# Patient Record
Sex: Male | Born: 1940 | Race: White | Hispanic: No | Marital: Married | Smoking: Former smoker
Health system: Southern US, Community
[De-identification: ages and names within clinical notes are randomized; demographics above are authoritative.]

## PROBLEM LIST (undated history)

## (undated) DIAGNOSIS — R030 Elevated blood-pressure reading, without diagnosis of hypertension: Secondary | ICD-10-CM

## (undated) DIAGNOSIS — E785 Hyperlipidemia, unspecified: Secondary | ICD-10-CM

## (undated) DIAGNOSIS — K5790 Diverticulosis of intestine, part unspecified, without perforation or abscess without bleeding: Secondary | ICD-10-CM

## (undated) DIAGNOSIS — G252 Other specified forms of tremor: Secondary | ICD-10-CM

## (undated) DIAGNOSIS — D126 Benign neoplasm of colon, unspecified: Secondary | ICD-10-CM

## (undated) DIAGNOSIS — I2510 Atherosclerotic heart disease of native coronary artery without angina pectoris: Secondary | ICD-10-CM

## (undated) DIAGNOSIS — R319 Hematuria, unspecified: Secondary | ICD-10-CM

## (undated) DIAGNOSIS — N529 Male erectile dysfunction, unspecified: Secondary | ICD-10-CM

## (undated) DIAGNOSIS — U071 COVID-19: Secondary | ICD-10-CM

## (undated) DIAGNOSIS — Z860100 Personal history of colon polyps, unspecified: Secondary | ICD-10-CM

## (undated) HISTORY — DX: Elevated blood-pressure reading, without diagnosis of hypertension: R03.0

## (undated) HISTORY — DX: Atherosclerotic heart disease of native coronary artery without angina pectoris: I25.10

## (undated) HISTORY — DX: Hyperlipidemia, unspecified: E78.5

## (undated) HISTORY — DX: Personal history of colon polyps, unspecified: Z86.0100

## (undated) HISTORY — DX: COVID-19: U07.1

## (undated) HISTORY — DX: Other specified forms of tremor: G25.2

## (undated) HISTORY — DX: Benign neoplasm of colon, unspecified: D12.6

## (undated) HISTORY — DX: Diverticulosis of intestine, part unspecified, without perforation or abscess without bleeding: K57.90

## (undated) HISTORY — DX: Male erectile dysfunction, unspecified: N52.9

## (undated) HISTORY — PX: APPENDECTOMY: SHX54

## (undated) HISTORY — DX: Hematuria, unspecified: R31.9

---

## 2016-05-17 DIAGNOSIS — Z131 Encounter for screening for diabetes mellitus: Secondary | ICD-10-CM | POA: Diagnosis not present

## 2016-05-17 DIAGNOSIS — Z125 Encounter for screening for malignant neoplasm of prostate: Secondary | ICD-10-CM | POA: Diagnosis not present

## 2016-05-17 DIAGNOSIS — E785 Hyperlipidemia, unspecified: Secondary | ICD-10-CM | POA: Diagnosis not present

## 2016-05-17 DIAGNOSIS — Z0000 Encounter for general adult medical examination without abnormal findings: Secondary | ICD-10-CM | POA: Diagnosis not present

## 2016-10-10 DIAGNOSIS — D492 Neoplasm of unspecified behavior of bone, soft tissue, and skin: Secondary | ICD-10-CM | POA: Diagnosis not present

## 2016-10-10 DIAGNOSIS — D229 Melanocytic nevi, unspecified: Secondary | ICD-10-CM | POA: Diagnosis not present

## 2016-10-10 DIAGNOSIS — L820 Inflamed seborrheic keratosis: Secondary | ICD-10-CM | POA: Diagnosis not present

## 2016-10-10 DIAGNOSIS — L821 Other seborrheic keratosis: Secondary | ICD-10-CM | POA: Diagnosis not present

## 2018-03-25 DIAGNOSIS — L821 Other seborrheic keratosis: Secondary | ICD-10-CM | POA: Diagnosis not present

## 2018-03-25 DIAGNOSIS — D229 Melanocytic nevi, unspecified: Secondary | ICD-10-CM | POA: Diagnosis not present

## 2018-03-25 DIAGNOSIS — L820 Inflamed seborrheic keratosis: Secondary | ICD-10-CM | POA: Diagnosis not present

## 2018-03-25 DIAGNOSIS — D485 Neoplasm of uncertain behavior of skin: Secondary | ICD-10-CM | POA: Diagnosis not present

## 2019-05-23 DIAGNOSIS — Z0000 Encounter for general adult medical examination without abnormal findings: Secondary | ICD-10-CM | POA: Diagnosis not present

## 2019-05-23 DIAGNOSIS — E785 Hyperlipidemia, unspecified: Secondary | ICD-10-CM | POA: Diagnosis not present

## 2019-05-23 DIAGNOSIS — Z125 Encounter for screening for malignant neoplasm of prostate: Secondary | ICD-10-CM | POA: Diagnosis not present

## 2019-05-23 DIAGNOSIS — R030 Elevated blood-pressure reading, without diagnosis of hypertension: Secondary | ICD-10-CM | POA: Diagnosis not present

## 2019-06-13 DIAGNOSIS — Z125 Encounter for screening for malignant neoplasm of prostate: Secondary | ICD-10-CM | POA: Diagnosis not present

## 2019-06-13 DIAGNOSIS — R030 Elevated blood-pressure reading, without diagnosis of hypertension: Secondary | ICD-10-CM | POA: Diagnosis not present

## 2019-06-13 DIAGNOSIS — E785 Hyperlipidemia, unspecified: Secondary | ICD-10-CM | POA: Diagnosis not present

## 2019-07-28 DIAGNOSIS — R972 Elevated prostate specific antigen [PSA]: Secondary | ICD-10-CM | POA: Diagnosis not present

## 2020-03-12 DIAGNOSIS — Z1159 Encounter for screening for other viral diseases: Secondary | ICD-10-CM | POA: Diagnosis not present

## 2020-03-17 DIAGNOSIS — D125 Benign neoplasm of sigmoid colon: Secondary | ICD-10-CM | POA: Diagnosis not present

## 2020-03-17 DIAGNOSIS — D123 Benign neoplasm of transverse colon: Secondary | ICD-10-CM | POA: Diagnosis not present

## 2020-03-17 DIAGNOSIS — Z86010 Personal history of colonic polyps: Secondary | ICD-10-CM | POA: Diagnosis not present

## 2020-03-19 DIAGNOSIS — D125 Benign neoplasm of sigmoid colon: Secondary | ICD-10-CM | POA: Diagnosis not present

## 2020-03-19 DIAGNOSIS — D123 Benign neoplasm of transverse colon: Secondary | ICD-10-CM | POA: Diagnosis not present

## 2020-05-25 DIAGNOSIS — K4090 Unilateral inguinal hernia, without obstruction or gangrene, not specified as recurrent: Secondary | ICD-10-CM | POA: Diagnosis not present

## 2020-05-25 DIAGNOSIS — Z23 Encounter for immunization: Secondary | ICD-10-CM | POA: Diagnosis not present

## 2020-05-25 DIAGNOSIS — R030 Elevated blood-pressure reading, without diagnosis of hypertension: Secondary | ICD-10-CM | POA: Diagnosis not present

## 2020-05-25 DIAGNOSIS — Z0000 Encounter for general adult medical examination without abnormal findings: Secondary | ICD-10-CM | POA: Diagnosis not present

## 2020-05-25 DIAGNOSIS — E785 Hyperlipidemia, unspecified: Secondary | ICD-10-CM | POA: Diagnosis not present

## 2020-05-25 DIAGNOSIS — R001 Bradycardia, unspecified: Secondary | ICD-10-CM | POA: Diagnosis not present

## 2021-01-11 ENCOUNTER — Ambulatory Visit: Payer: Medicare PPO | Admitting: Dermatology

## 2021-01-11 ENCOUNTER — Encounter: Payer: Self-pay | Admitting: Dermatology

## 2021-01-11 ENCOUNTER — Other Ambulatory Visit: Payer: Self-pay

## 2021-01-11 DIAGNOSIS — D485 Neoplasm of uncertain behavior of skin: Secondary | ICD-10-CM

## 2021-01-11 DIAGNOSIS — L918 Other hypertrophic disorders of the skin: Secondary | ICD-10-CM | POA: Diagnosis not present

## 2021-01-11 DIAGNOSIS — L821 Other seborrheic keratosis: Secondary | ICD-10-CM

## 2021-01-11 DIAGNOSIS — Z1283 Encounter for screening for malignant neoplasm of skin: Secondary | ICD-10-CM

## 2021-01-11 DIAGNOSIS — D0439 Carcinoma in situ of skin of other parts of face: Secondary | ICD-10-CM

## 2021-01-11 NOTE — Patient Instructions (Signed)

## 2021-01-17 ENCOUNTER — Telehealth: Payer: Self-pay

## 2021-01-17 NOTE — Telephone Encounter (Signed)
-----   Message from Lavonna Monarch, MD sent at 01/14/2021  7:29 AM EDT ----- Schedule surgery with Dr. Darene Lamer

## 2021-01-17 NOTE — Telephone Encounter (Signed)
Left message to call office needs 30 minute for scc

## 2021-01-18 NOTE — Telephone Encounter (Signed)
Patient is returning telephone call from yesterday about pathology results.

## 2021-01-24 ENCOUNTER — Telehealth: Payer: Self-pay | Admitting: Dermatology

## 2021-01-24 NOTE — Telephone Encounter (Signed)
Results, ST. Has called before but no one called him back

## 2021-01-24 NOTE — Telephone Encounter (Signed)
Pathology to patient-surgery appointment scheduled.  

## 2021-01-26 ENCOUNTER — Encounter: Payer: Self-pay | Admitting: Dermatology

## 2021-01-26 ENCOUNTER — Other Ambulatory Visit: Payer: Self-pay

## 2021-01-26 ENCOUNTER — Ambulatory Visit (INDEPENDENT_AMBULATORY_CARE_PROVIDER_SITE_OTHER): Payer: Medicare PPO | Admitting: Dermatology

## 2021-01-26 DIAGNOSIS — D0439 Carcinoma in situ of skin of other parts of face: Secondary | ICD-10-CM

## 2021-01-26 DIAGNOSIS — D099 Carcinoma in situ, unspecified: Secondary | ICD-10-CM

## 2021-01-26 NOTE — Patient Instructions (Signed)

## 2021-01-30 ENCOUNTER — Encounter: Payer: Self-pay | Admitting: Dermatology

## 2021-01-30 NOTE — Progress Notes (Signed)
   Follow-Up Visit   Subjective  Matthew Singleton is a 80 y.o. male who presents for the following: Annual Exam (Left forehead x 1 month- little red & tender as first now just rough).  General skin examination, growth on forehead Location:  Duration:  Quality:  Associated Signs/Symptoms: Modifying Factors:  Severity:  Timing: Context:   Objective  Well appearing patient in no apparent distress; mood and affect are within normal limits. Mid Back Full body skin exam.  No atypical pigmented lesions.  1 possible nonmelanoma skin cancer forehead will be biopsied  Left Forehead Waxy pink 6 mm crust, superficial carcinoma       Mid Back Multiple textured brown flattopped 4 8 mm papules  Left Axilla, Right Axilla Fleshy, skin-colored  pedunculated papules.      A full examination was performed including scalp, head, eyes, ears, nose, lips, neck, chest, axillae, abdomen, back, buttocks, bilateral upper extremities, bilateral lower extremities, hands, feet, fingers, toes, fingernails, and toenails. All findings within normal limits unless otherwise noted below.  Areas beneath under garment not fully examined   Assessment & Plan    Screening for malignant neoplasm of skin Mid Back  Yearly skin exams.   Neoplasm of uncertain behavior of skin Left Forehead  Skin / nail biopsy Type of biopsy: tangential   Informed consent: discussed and consent obtained   Timeout: patient name, date of birth, surgical site, and procedure verified   Anesthesia: the lesion was anesthetized in a standard fashion   Anesthetic:  1% lidocaine w/ epinephrine 1-100,000 local infiltration Instrument used: flexible razor blade   Hemostasis achieved with: ferric subsulfate   Outcome: patient tolerated procedure well   Post-procedure details: wound care instructions given    Specimen 1 - Surgical pathology Differential Diagnosis: scc vs bcc  Check Margins: No  Seborrheic keratosis Mid  Back  Leave if stable  Skin tag (2) Left Axilla; Right Axilla  No intervention necessary      I, Lavonna Monarch, MD, have reviewed all documentation for this visit.  The documentation on 01/30/21 for the exam, diagnosis, procedures, and orders are all accurate and complete.

## 2021-02-12 ENCOUNTER — Encounter: Payer: Self-pay | Admitting: Dermatology

## 2021-03-09 NOTE — Progress Notes (Signed)
   Follow-Up Visit   Subjective  Matthew Singleton is a 80 y.o. male who presents for the following: Procedure (Here for treatment- left forehead- cis x 1).  Skin cancer left forehead Location:  Duration:  Quality:  Associated Signs/Symptoms: Modifying Factors:  Severity:  Timing: Context:   Objective  Well appearing patient in no apparent distress; mood and affect are within normal limits. Left Forehead Lesion identified by Dr.Neima Lacross and nurse in room.      A focused examination was performed including head and neck. Relevant physical exam findings are noted in the Assessment and Plan.   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  Destruction of lesion Complexity: simple   Destruction method: electrodesiccation and curettage   Informed consent: discussed and consent obtained   Timeout:  patient name, date of birth, surgical site, and procedure verified Anesthesia: the lesion was anesthetized in a standard fashion   Anesthetic:  1% lidocaine w/ epinephrine 1-100,000 local infiltration Curettage performed in three different directions: Yes   Curettage cycles:  3 Lesion length (cm):  1.2 Lesion width (cm):  1.2 Margin per side (cm):  0 Final wound size (cm):  1.2 Hemostasis achieved with:  ferric subsulfate Outcome: patient tolerated procedure well with no complications   Post-procedure details: sterile dressing applied and wound care instructions given   Dressing type: bandage and petrolatum   Additional details:  Wound innoculated with 5 fluorouracil solution.      I, Lavonna Monarch, MD, have reviewed all documentation for this visit.  The documentation on 03/09/21 for the exam, diagnosis, procedures, and orders are all accurate and complete.

## 2021-03-17 ENCOUNTER — Encounter: Payer: Medicare PPO | Admitting: Dermatology

## 2021-04-25 ENCOUNTER — Encounter: Payer: Self-pay | Admitting: Dermatology

## 2021-04-25 ENCOUNTER — Ambulatory Visit: Payer: Medicare Other | Admitting: Dermatology

## 2021-04-25 ENCOUNTER — Other Ambulatory Visit: Payer: Self-pay

## 2021-04-25 DIAGNOSIS — Z8589 Personal history of malignant neoplasm of other organs and systems: Secondary | ICD-10-CM | POA: Diagnosis not present

## 2021-04-25 DIAGNOSIS — D692 Other nonthrombocytopenic purpura: Secondary | ICD-10-CM | POA: Diagnosis not present

## 2021-04-25 DIAGNOSIS — L821 Other seborrheic keratosis: Secondary | ICD-10-CM | POA: Diagnosis not present

## 2021-04-25 NOTE — Patient Instructions (Signed)
Dermend over the counter pick up lotion for the arms.

## 2021-05-15 NOTE — Progress Notes (Signed)
° °  Follow-Up Visit   Subjective  Matthew Singleton is a 81 y.o. male who presents for the following: Follow-up (Follow up for lesion on forehead. Pt was here in October and treated scc.).  Follow-up with CIS left forehead and check other spots. Location:  Duration:  Quality:  Associated Signs/Symptoms: Modifying Factors:  Severity:  Timing: Context:   Objective  Well appearing patient in no apparent distress; mood and affect are within normal limits. Left Malar Cheek, Right Temple Textured brown flattopped papules, temple lesion recurrent.  Also has an incidental skin tag behind Ear.   Left Forearm - Posterior Ecchymoses forearms, denies other abnormal bleeding  Left Forehead No sign residual carcinoma.    A focused examination was performed including head, neck, arms. Relevant physical exam findings are noted in the Assessment and Plan.   Assessment & Plan    Seborrheic keratosis (2) Right Temple; Left Malar Cheek  Leave if stable  Solar purpura ([place]) Left Forearm - Posterior  Pick up over the counter dermend lotion   History of squamous cell carcinoma Left Forehead      I, Lavonna Monarch, MD, have reviewed all documentation for this visit.  The documentation on 05/15/21 for the exam, diagnosis, procedures, and orders are all accurate and complete.

## 2021-06-02 DIAGNOSIS — R001 Bradycardia, unspecified: Secondary | ICD-10-CM | POA: Diagnosis not present

## 2021-06-02 DIAGNOSIS — R030 Elevated blood-pressure reading, without diagnosis of hypertension: Secondary | ICD-10-CM | POA: Diagnosis not present

## 2021-06-02 DIAGNOSIS — E785 Hyperlipidemia, unspecified: Secondary | ICD-10-CM | POA: Diagnosis not present

## 2021-06-02 DIAGNOSIS — Z0000 Encounter for general adult medical examination without abnormal findings: Secondary | ICD-10-CM | POA: Diagnosis not present

## 2021-06-07 ENCOUNTER — Other Ambulatory Visit: Payer: Self-pay | Admitting: Family Medicine

## 2021-06-07 DIAGNOSIS — K4090 Unilateral inguinal hernia, without obstruction or gangrene, not specified as recurrent: Secondary | ICD-10-CM

## 2021-06-13 ENCOUNTER — Other Ambulatory Visit: Payer: Self-pay

## 2021-06-13 ENCOUNTER — Ambulatory Visit
Admission: RE | Admit: 2021-06-13 | Discharge: 2021-06-13 | Disposition: A | Discharge status: Home / Self Care | Payer: Medicare Other | Source: Ambulatory Visit | Attending: Family Medicine | Admitting: Family Medicine

## 2021-06-13 DIAGNOSIS — K4090 Unilateral inguinal hernia, without obstruction or gangrene, not specified as recurrent: Secondary | ICD-10-CM

## 2021-06-27 DIAGNOSIS — K4090 Unilateral inguinal hernia, without obstruction or gangrene, not specified as recurrent: Secondary | ICD-10-CM | POA: Diagnosis not present

## 2021-08-25 DIAGNOSIS — K4090 Unilateral inguinal hernia, without obstruction or gangrene, not specified as recurrent: Secondary | ICD-10-CM | POA: Diagnosis not present

## 2021-11-01 ENCOUNTER — Encounter: Payer: Self-pay | Admitting: Dermatology

## 2021-11-01 ENCOUNTER — Ambulatory Visit: Payer: Medicare Other | Admitting: Dermatology

## 2021-11-01 DIAGNOSIS — D485 Neoplasm of uncertain behavior of skin: Secondary | ICD-10-CM

## 2021-11-01 DIAGNOSIS — Z85828 Personal history of other malignant neoplasm of skin: Secondary | ICD-10-CM | POA: Diagnosis not present

## 2021-11-01 DIAGNOSIS — L820 Inflamed seborrheic keratosis: Secondary | ICD-10-CM

## 2021-11-01 NOTE — Patient Instructions (Signed)

## 2021-11-23 ENCOUNTER — Encounter: Payer: Self-pay | Admitting: Dermatology

## 2021-11-23 NOTE — Progress Notes (Signed)
   Follow-Up Visit   Subjective  Matthew Singleton is a 81 y.o. male who presents for the following: Skin Problem (Left forehead near a old lesion x months).  Check site of skin cancer left forehead, new crust nearby Location:  Duration:  Quality:  Associated Signs/Symptoms: Modifying Factors:  Severity:  Timing: Context:   Objective  Well appearing patient in no apparent distress; mood and affect are within normal limits. Left sup  Forehead  eroded waxy pink 8 mm crust, dermoscopy looks like a collision of irritated keratosis and superficial carcinoma     Left Forehead On site of carcinoma in situ from last year appears to be clear    A focused examination was performed including head and neck. Relevant physical exam findings are noted in the Assessment and Plan.   Assessment & Plan    Neoplasm of uncertain behavior of skin Left sup  Forehead  Skin / nail biopsy Type of biopsy: tangential   Informed consent: discussed and consent obtained   Timeout: patient name, date of birth, surgical site, and procedure verified   Anesthesia: the lesion was anesthetized in a standard fashion   Anesthetic:  1% lidocaine w/ epinephrine 1-100,000 local infiltration Instrument used: flexible razor blade   Hemostasis achieved with: aluminum chloride and electrodesiccation   Outcome: patient tolerated procedure well   Post-procedure details: wound care instructions given    Specimen 1 - Surgical pathology Differential Diagnosis: bcc scc  Check Margins: No  Personal history of skin cancer Left Forehead  Recheck as needed change      I, Lavonna Monarch, MD, have reviewed all documentation for this visit.  The documentation on 11/23/21 for the exam, diagnosis, procedures, and orders are all accurate and complete.

## 2022-01-11 ENCOUNTER — Ambulatory Visit: Payer: Medicare PPO | Admitting: Dermatology

## 2022-06-07 DIAGNOSIS — U071 COVID-19: Secondary | ICD-10-CM | POA: Diagnosis not present

## 2022-06-16 DIAGNOSIS — R001 Bradycardia, unspecified: Secondary | ICD-10-CM | POA: Diagnosis not present

## 2022-06-16 DIAGNOSIS — E785 Hyperlipidemia, unspecified: Secondary | ICD-10-CM | POA: Diagnosis not present

## 2022-06-16 DIAGNOSIS — R030 Elevated blood-pressure reading, without diagnosis of hypertension: Secondary | ICD-10-CM | POA: Diagnosis not present

## 2022-06-16 DIAGNOSIS — Z0000 Encounter for general adult medical examination without abnormal findings: Secondary | ICD-10-CM | POA: Diagnosis not present

## 2022-07-25 DIAGNOSIS — H26493 Other secondary cataract, bilateral: Secondary | ICD-10-CM | POA: Diagnosis not present

## 2022-07-25 DIAGNOSIS — H1045 Other chronic allergic conjunctivitis: Secondary | ICD-10-CM | POA: Diagnosis not present

## 2022-07-25 DIAGNOSIS — Z961 Presence of intraocular lens: Secondary | ICD-10-CM | POA: Diagnosis not present

## 2022-07-25 DIAGNOSIS — H04123 Dry eye syndrome of bilateral lacrimal glands: Secondary | ICD-10-CM | POA: Diagnosis not present

## 2022-08-17 DIAGNOSIS — J189 Pneumonia, unspecified organism: Secondary | ICD-10-CM | POA: Diagnosis not present

## 2022-08-18 ENCOUNTER — Other Ambulatory Visit: Payer: Self-pay | Admitting: Family Medicine

## 2022-08-18 ENCOUNTER — Ambulatory Visit
Admission: RE | Admit: 2022-08-18 | Discharge: 2022-08-18 | Disposition: A | Discharge status: Home / Self Care | Payer: Medicare Other | Source: Ambulatory Visit | Attending: Family Medicine | Admitting: Family Medicine

## 2022-08-18 DIAGNOSIS — J189 Pneumonia, unspecified organism: Secondary | ICD-10-CM

## 2022-08-24 DIAGNOSIS — R9389 Abnormal findings on diagnostic imaging of other specified body structures: Secondary | ICD-10-CM | POA: Diagnosis not present

## 2022-08-29 ENCOUNTER — Ambulatory Visit
Admission: RE | Admit: 2022-08-29 | Discharge: 2022-08-29 | Disposition: A | Discharge status: Home / Self Care | Payer: Medicare Other | Source: Ambulatory Visit | Attending: Family Medicine | Admitting: Family Medicine

## 2022-08-29 ENCOUNTER — Other Ambulatory Visit: Payer: Self-pay | Admitting: Family Medicine

## 2022-08-29 DIAGNOSIS — R9389 Abnormal findings on diagnostic imaging of other specified body structures: Secondary | ICD-10-CM

## 2022-08-29 DIAGNOSIS — K449 Diaphragmatic hernia without obstruction or gangrene: Secondary | ICD-10-CM | POA: Diagnosis not present

## 2022-08-29 DIAGNOSIS — J439 Emphysema, unspecified: Secondary | ICD-10-CM | POA: Diagnosis not present

## 2022-08-29 DIAGNOSIS — I700 Atherosclerosis of aorta: Secondary | ICD-10-CM | POA: Diagnosis not present

## 2022-08-29 DIAGNOSIS — R918 Other nonspecific abnormal finding of lung field: Secondary | ICD-10-CM | POA: Diagnosis not present

## 2022-08-29 MED ORDER — IOPAMIDOL (ISOVUE-300) INJECTION 61%
75.0000 mL | Freq: Once | INTRAVENOUS | Status: AC | PRN
  Administered 2022-08-29: 75 mL via INTRAVENOUS

## 2022-09-05 NOTE — Progress Notes (Unsigned)
   Synopsis: Referred for hydropneumothorax by Shon Hale, *  Subjective:   PATIENT ID: Matthew Singleton GENDER: male DOB: [birth year], MRN: [id number]  No chief complaint on file.  82yM with history of referred for R fluid filled bulla measuring 11cm  Otherwise pertinent review of systems is negative.  No past medical history on file.   No family history on file.   No past surgical history on file.  Social History   Socioeconomic History   Marital status: Single    Spouse name: Not on file   Number of children: Not on file   Years of education: Not on file   Highest education level: Not on file  Occupational History   Not on file  Tobacco Use   Smoking status: Unknown   Smokeless tobacco: Not on file  Vaping Use   Vaping Use: Never used  Substance and Sexual Activity   Alcohol use: Not Currently   Drug use: Never   Sexual activity: Not on file  Other Topics Concern   Not on file  Social History Narrative   Not on file   Social Determinants of Health   Financial Resource Strain: Not on file  Food Insecurity: Not on file  Transportation Needs: Not on file  Physical Activity: Not on file  Stress: Not on file  Social Connections: Not on file  Intimate Partner Violence: Not on file     Allergies  Allergen Reactions   Penicillins Rash     No outpatient medications prior to visit.   No facility-administered medications prior to visit.       Objective:   Physical Exam:  General appearance: 82 y.o., male, NAD, conversant  Eyes: anicteric sclerae; PERRL, tracking appropriately HENT: NCAT; MMM Neck: Trachea midline; no lymphadenopathy, no JVD Lungs: CTAB, no crackles, no wheeze, with normal respiratory effort CV: RRR, no murmur  Abdomen: Soft, non-tender; non-distended, BS present  Extremities: No peripheral edema, warm Skin: Normal turgor and texture; no rash Psych: Appropriate affect Neuro: Alert and oriented to person and place, no focal  deficit     There were no vitals filed for this visit.   on *** LPM *** RA BMI Readings from Last 3 Encounters:  No data found for BMI   Wt Readings from Last 3 Encounters:  No data found for Wt     CBC No results found for: "WBC", "RBC", "HGB", "HCT", "PLT", "MCV", "MCH", "MCHC", "RDW", "LYMPHSABS", "MONOABS", "EOSABS", "BASOSABS"  ***  Chest Imaging: CT Chest 08/29/22 with large hiatal hernia, 11cm bullous lesion, fluid filled with some thickening of wall, paraseptal emphysema, LUL 1.1cm pleural nodule  Pulmonary Functions Testing Results:     No data to display          FeNO: ***  Pathology: ***  Echocardiogram: ***  Heart Catheterization: ***    Assessment & Plan:    Plan:      Omar Person, MD [facility] 09/05/2022 11:24 AM

## 2022-09-06 ENCOUNTER — Encounter: Payer: Self-pay | Admitting: Student

## 2022-09-06 ENCOUNTER — Ambulatory Visit: Payer: Medicare Other | Admitting: Student

## 2022-09-06 VITALS — BP 128/70 | HR 60 | Temp 98.2°F | Ht 73.0 in | Wt 183.4 lb

## 2022-09-06 DIAGNOSIS — J852 Abscess of lung without pneumonia: Secondary | ICD-10-CM | POA: Diagnosis not present

## 2022-09-06 MED ORDER — CEFDINIR 300 MG PO CAPS
300.0000 mg | ORAL_CAPSULE | Freq: Two times a day (BID) | ORAL | 0 refills | Status: DC
Start: 2022-09-06 — End: 2022-09-06

## 2022-09-06 MED ORDER — CEFDINIR 300 MG PO CAPS: 300.0000 mg | ORAL_CAPSULE | Freq: Two times a day (BID) | ORAL | 0 refills | Status: DC

## 2022-09-06 NOTE — Patient Instructions (Addendum)
-   4 weeks of cefdinir - CT Chest in 6-8 weeks

## 2022-10-20 ENCOUNTER — Ambulatory Visit (HOSPITAL_BASED_OUTPATIENT_CLINIC_OR_DEPARTMENT_OTHER)
Admission: RE | Admit: 2022-10-20 | Discharge: 2022-10-20 | Disposition: A | Discharge status: Home / Self Care | Payer: Medicare Other | Source: Ambulatory Visit | Attending: Student | Admitting: Student

## 2022-10-20 DIAGNOSIS — J432 Centrilobular emphysema: Secondary | ICD-10-CM | POA: Diagnosis not present

## 2022-10-20 DIAGNOSIS — J852 Abscess of lung without pneumonia: Secondary | ICD-10-CM | POA: Insufficient documentation

## 2022-10-20 DIAGNOSIS — I700 Atherosclerosis of aorta: Secondary | ICD-10-CM | POA: Diagnosis not present

## 2022-10-20 DIAGNOSIS — R918 Other nonspecific abnormal finding of lung field: Secondary | ICD-10-CM | POA: Diagnosis not present

## 2022-11-20 ENCOUNTER — Encounter: Payer: Self-pay | Admitting: Pulmonary Disease

## 2022-11-20 ENCOUNTER — Ambulatory Visit: Payer: Medicare Other | Admitting: Pulmonary Disease

## 2022-11-20 VITALS — BP 130/76 | HR 57 | Temp 97.9°F | Ht 73.0 in | Wt 188.4 lb

## 2022-11-20 DIAGNOSIS — J852 Abscess of lung without pneumonia: Secondary | ICD-10-CM | POA: Diagnosis not present

## 2022-11-20 MED ORDER — CEFDINIR 300 MG PO CAPS: 300.0000 mg | ORAL_CAPSULE | Freq: Two times a day (BID) | ORAL | 1 refills | Status: DC

## 2022-11-20 NOTE — Progress Notes (Signed)
Synopsis: Referred for hydropneumothorax by Shon Hale, *  Subjective:   PATIENT ID: Matthew Singleton GENDER: male DOB: 21-Dec-1940, MRN: [id number]  Chief Complaint  Patient presents with   Follow-up    Doing well   82yM with history of referred for R fluid filled bulla measuring 11cm. Had covid-19 in February 2024 in [place]. Bout of pneumonia 5/15 seen by PCP - he doesn't recall feeling all that bad but did have pleuritic CP, some cough, low grade fever. He was on a z pack and something else, possibly omnicef. For the last couple weeks however he has had cough with clearish sputum. Pleuritic CP has improved.   He has no trouble with solid or liquid dysphagia  No recent prior CT Chest   Otherwise pertinent review of systems is negative.  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  No vaping, MJ.   Today 11/20/22 CT Chest 10/20/22 showed decrease in size of the cavitary lesion of RUL which now measures 4.5 x 4.2cm comapred to 6.2 x 4.3cm. Other nodules are stable in size.  Patient reports feeling much better since last visit.  He has significant less cough.  He denies any mucus production.  Denies any fevers, chills or night sweats.  Denies any lack of appetite or weight loss.   No past medical history on file.   Family History  Problem Relation Age of Onset   Heart attack Father    Throat cancer Father        smoked   Cancer - Lung Sister        smoked     No past surgical history on file.  Social History   Socioeconomic History   Marital status: Single    Spouse name: Not on file   Number of children: Not on file   Years of education: Not on file   Highest education level: Not on file  Occupational History   Not on file  Tobacco Use   Smoking status: Former    Current packs/day: 0.00    Average packs/day: 2.0 packs/day for 14.0 years (28.0 ttl  pk-yrs)    Types: Cigarettes    Start date: 04/10/1958    Quit date: 04/10/1972    Years since quitting: 50.6   Smokeless tobacco: Not on file  Vaping Use   Vaping status: Never Used  Substance and Sexual Activity   Alcohol use: Not Currently   Drug use: Never   Sexual activity: Not on file  Other Topics Concern   Not on file  Social History Narrative   Retired Naval architect   Social Determinants of Corporate investment banker Strain: Not on file  Food Insecurity: Not on file  Transportation Needs: Not on file  Physical Activity: Not on file  Stress: Not on file  Social Connections: Not on file  Intimate Partner Violence: Not on file     Allergies  Allergen Reactions   Penicillins Rash     Outpatient Medications Prior to Visit  Medication Sig Dispense Refill   loratadine (CLARITIN) 10 MG tablet Take 10 mg by mouth daily as needed for allergies.     cefdinir (OMNICEF) 300 MG capsule Take 1 capsule (300 mg total) by mouth 2 (two) times daily. 56 capsule 0   No facility-administered medications prior to visit.       Objective:  Physical Exam:  General appearance: 82 y.o., male, NAD, conversant  Eyes: anicteric sclerae; PERRL, tracking appropriately HENT: NCAT; MMM Neck: Trachea midline; no lymphadenopathy, no JVD Lungs: CTAB, no crackles, no wheeze, with normal respiratory effort CV: RRR, no murmur  Abdomen: Soft, non-tender; non-distended, BS present  Extremities: No peripheral edema, warm Skin: Normal turgor and texture; no rash Psych: Appropriate affect Neuro: Alert and oriented to person and place, no focal deficit     Vitals:   11/20/22 1128  BP: 130/76  Pulse: (!) 57  Temp: 97.9 F (36.6 C)  TempSrc: Oral  SpO2: (!) 8%  Weight: 188 lb 6.4 oz (85.5 kg)  Height: 6\' 1"  (1.854 m)    (!) 8% on RA BMI Readings from Last 3 Encounters:  11/20/22 24.86 kg/m  09/06/22 24.20 kg/m   Wt Readings from Last 3 Encounters:  11/20/22 188 lb 6.4 oz (85.5  kg)  09/06/22 183 lb 6.4 oz (83.2 kg)     CBC No results found for: "WBC", "RBC", "HGB", "HCT", "PLT", "MCV", "MCH", "MCHC", "RDW", "LYMPHSABS", "MONOABS", "EOSABS", "BASOSABS"   Chest Imaging: CT Chest 08/29/22 with large hiatal hernia, 11cm bullous lesion, fluid filled with some thickening of wall, paraseptal emphysema, LUL 1.1cm pleural nodule  Pulmonary Functions Testing Results:     No data to display              Assessment & Plan:   # RLL Lung abscess  Alternatively superinfected cyst  # Mild extent of paraseptal emphysema  # History of 25 py smoking, quit 1974  Plan: - Resume cefdinir daily for 2 more months - CT Chest in 2 months   Follow up in 2 months after CT scan with myself or APP.   Martina Sinner, MD [facility] 11/27/2022 10:59 AM

## 2022-11-20 NOTE — Patient Instructions (Addendum)
Resume Cefdinir two times daily for 2 more months  We will repeat a CT Chest scan in 2 months  Follow up with our nurse practitioners in 2 months after CT Chest scan

## 2022-11-27 ENCOUNTER — Encounter: Payer: Self-pay | Admitting: Pulmonary Disease

## 2023-02-05 ENCOUNTER — Ambulatory Visit (HOSPITAL_BASED_OUTPATIENT_CLINIC_OR_DEPARTMENT_OTHER)
Admission: RE | Admit: 2023-02-05 | Discharge: 2023-02-05 | Disposition: A | Discharge status: Home / Self Care | Payer: Medicare Other | Source: Ambulatory Visit | Attending: Pulmonary Disease | Admitting: Pulmonary Disease

## 2023-02-05 DIAGNOSIS — R918 Other nonspecific abnormal finding of lung field: Secondary | ICD-10-CM | POA: Diagnosis not present

## 2023-02-05 DIAGNOSIS — J852 Abscess of lung without pneumonia: Secondary | ICD-10-CM | POA: Diagnosis not present

## 2023-02-05 DIAGNOSIS — J432 Centrilobular emphysema: Secondary | ICD-10-CM | POA: Diagnosis not present

## 2023-02-14 ENCOUNTER — Ambulatory Visit: Payer: Medicare Other | Admitting: Pulmonary Disease

## 2023-02-14 ENCOUNTER — Encounter: Payer: Self-pay | Admitting: Pulmonary Disease

## 2023-02-14 VITALS — BP 150/80 | HR 70 | Ht 73.0 in | Wt 190.2 lb

## 2023-02-14 DIAGNOSIS — J852 Abscess of lung without pneumonia: Secondary | ICD-10-CM

## 2023-02-14 NOTE — Patient Instructions (Signed)
Your CT chest scan shows improvement compared to the last one.  We will repeat a CT Chest scan in 6 months  Follow up in 6 months after the CT scan

## 2023-02-14 NOTE — Progress Notes (Signed)
Synopsis: Referred for hydropneumothorax by Shon Hale, *  Subjective:   PATIENT ID: Matthew Singleton GENDER: male DOB: 05-Jul-1940, MRN: [id number]  Chief Complaint  Patient presents with   Follow-up    Pt is here for CT F/U visit.    82yM with history of referred for R fluid filled bulla measuring 11cm. Had covid-19 in February 2024 in [place]. Bout of pneumonia 5/15 seen by PCP - he doesn't recall feeling all that bad but did have pleuritic CP, some cough, low grade fever. He was on a z pack and something else, possibly omnicef. For the last couple weeks however he has had cough with clearish sputum. Pleuritic CP has improved.   He has no trouble with solid or liquid dysphagia  No recent prior CT Chest   Otherwise pertinent review of systems is negative.  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  No vaping, MJ.   OV 11/20/22 CT Chest 10/20/22 showed decrease in size of the cavitary lesion of RUL which now measures 4.5 x 4.2cm comapred to 6.2 x 4.3cm. Other nodules are stable in size.  Patient reports feeling much better since last visit.  He has significant less cough.  He denies any mucus production.  Denies any fevers, chills or night sweats.  Denies any lack of appetite or weight loss.  Today OV 02/14/23  follow-up visit after completing a course of antibiotics in October. They report no significant respiratory symptoms, with only a minor 'old man Thursday morning cough.' They deny any significant production of mucus or sputum.  The patient had a cavitary lesion in the lung, which has been monitored through CT scans. The most recent scan shows significant improvement in the lesion, which has mostly closed up and is now primarily scar tissue.    No past medical history on file.   Family History  Problem Relation Age of Onset   Heart attack Father    Throat  cancer Father        smoked   Cancer - Lung Sister        smoked     No past surgical history on file.  Social History   Socioeconomic History   Marital status: Single    Spouse name: Not on file   Number of children: Not on file   Years of education: Not on file   Highest education level: Not on file  Occupational History   Not on file  Tobacco Use   Smoking status: Former    Current packs/day: 0.00    Average packs/day: 2.0 packs/day for 14.0 years (28.0 ttl pk-yrs)    Types: Cigarettes    Start date: 04/10/1958    Quit date: 04/10/1972    Years since quitting: 50.8   Smokeless tobacco: Not on file  Vaping Use   Vaping status: Never Used  Substance and Sexual Activity   Alcohol use: Not Currently   Drug use: Never   Sexual activity: Not on file  Other Topics Concern   Not on file  Social History Narrative   Retired Naval architect   Social Determinants of Corporate investment banker Strain: Not on file  Food Insecurity: Not on file  Transportation Needs: Not on file  Physical Activity: Not on file  Stress: Not on file  Social Connections: Not on file  Intimate Partner Violence: Not  on file     Allergies  Allergen Reactions   Penicillins Rash     Outpatient Medications Prior to Visit  Medication Sig Dispense Refill   loratadine (CLARITIN) 10 MG tablet Take 10 mg by mouth daily as needed for allergies.     cefdinir (OMNICEF) 300 MG capsule Take 1 capsule (300 mg total) by mouth 2 (two) times daily. 56 capsule 1   No facility-administered medications prior to visit.       Objective:   Physical Exam:  General appearance: 82 y.o., male, NAD, conversant  Eyes: anicteric sclerae; PERRL, tracking appropriately HENT: NCAT; MMM Neck: Trachea midline; no lymphadenopathy, no JVD Lungs: CTAB, no crackles, no wheeze, with normal respiratory effort CV: RRR, no murmur  Abdomen: Soft, non-tender; non-distended, BS present  Extremities: No peripheral edema,  warm Skin: Normal turgor and texture; no rash Psych: Appropriate affect Neuro: Alert and oriented to person and place, no focal deficit     Vitals:   02/14/23 1056  BP: (!) 150/80  Pulse: 70  SpO2: 96%  Weight: 190 lb 3.2 oz (86.3 kg)  Height: 6\' 1"  (1.854 m)     96% on RA BMI Readings from Last 3 Encounters:  02/14/23 25.09 kg/m  11/20/22 24.86 kg/m  09/06/22 24.20 kg/m   Wt Readings from Last 3 Encounters:  02/14/23 190 lb 3.2 oz (86.3 kg)  11/20/22 188 lb 6.4 oz (85.5 kg)  09/06/22 183 lb 6.4 oz (83.2 kg)     CBC No results found for: "WBC", "RBC", "HGB", "HCT", "PLT", "MCV", "MCH", "MCHC", "RDW", "LYMPHSABS", "MONOABS", "EOSABS", "BASOSABS"   Chest Imaging: CT Chest 08/29/22 with large hiatal hernia, 11cm bullous lesion, fluid filled with some thickening of wall, paraseptal emphysema, LUL 1.1cm pleural nodule  Pulmonary Functions Testing Results:     No data to display              Assessment & Plan:   # RLL Lung abscess  Alternatively superinfected cyst  # Mild extent of paraseptal emphysema  #   Lung Abscess Significant improvement in cavitary lesion after antibiotic therapy, likely residual scarring. No current respiratory symptoms. Discussed the possibility of cancer, but given the response to antibiotics, it is less likely. -Order CT scan in 6 months to ensure complete resolution. -If any new symptoms arise before the 29-month mark, patient to contact the office for an earlier scan.  Mild extent of paraseptal emphysema History of 25 py smoking, quit 1974 - continue to monitor  Vaccinations Discussed the importance of receiving the flu and COVID-19 vaccines, especially in the context of recent illness. -Encouraged to get both the flu and COVID-19 vaccines, can be spaced out if preferred.  Follow up in 6 months after CT Chest scan  Melody Comas, MD [facility] Office: [phone]   See Amion for personal  pager PCCM on call pager [phone] until 7pm. Please call Elink 7p-7a. [phone]

## 2023-02-16 ENCOUNTER — Encounter: Payer: Self-pay | Admitting: Pulmonary Disease

## 2023-03-13 DIAGNOSIS — J441 Chronic obstructive pulmonary disease with (acute) exacerbation: Secondary | ICD-10-CM | POA: Diagnosis not present

## 2023-03-13 DIAGNOSIS — Z03818 Encounter for observation for suspected exposure to other biological agents ruled out: Secondary | ICD-10-CM | POA: Diagnosis not present

## 2023-03-13 DIAGNOSIS — I10 Essential (primary) hypertension: Secondary | ICD-10-CM | POA: Diagnosis not present

## 2023-05-02 ENCOUNTER — Telehealth: Payer: Self-pay | Admitting: Pulmonary Disease

## 2023-05-02 NOTE — Telephone Encounter (Signed)
Fax received from Dr. Vida Rigger with [facility] to perform a colonoscopy with propofol on patient.  Patient needs surgery clearance. Surgery is pending. Patient was seen on 02/14/23. Office protocol is a risk assessment can be sent to surgeon if patient has been seen in 60 days or less.   Sending to Dr Francine Graven for risk assessment or recommendations if patient needs to be seen in office prior to surgical procedure.

## 2023-05-03 NOTE — Telephone Encounter (Signed)
I have faxed copy of this risk assessment to [facility] (Dr. Ewing Schlein).

## 2023-05-03 NOTE — Telephone Encounter (Signed)
 ARISCAT Score for Postoperative Pulmonary Complications  Low risk 1.6% risk of in-hospital post-op pulmonary complications (composite including respiratory failure, respiratory infection, pleural effusion, atelectasis, pneumothorax, bronchospasm, aspiration pneumonitis)

## 2023-05-22 DIAGNOSIS — Z860100 Personal history of colon polyps, unspecified: Secondary | ICD-10-CM | POA: Diagnosis not present

## 2023-05-22 DIAGNOSIS — Z09 Encounter for follow-up examination after completed treatment for conditions other than malignant neoplasm: Secondary | ICD-10-CM | POA: Diagnosis not present

## 2023-05-22 DIAGNOSIS — K5520 Angiodysplasia of colon without hemorrhage: Secondary | ICD-10-CM | POA: Diagnosis not present

## 2023-05-22 DIAGNOSIS — D122 Benign neoplasm of ascending colon: Secondary | ICD-10-CM | POA: Diagnosis not present

## 2023-05-24 DIAGNOSIS — D122 Benign neoplasm of ascending colon: Secondary | ICD-10-CM | POA: Diagnosis not present

## 2023-06-20 ENCOUNTER — Ambulatory Visit (HOSPITAL_BASED_OUTPATIENT_CLINIC_OR_DEPARTMENT_OTHER)

## 2023-06-25 DIAGNOSIS — Z0000 Encounter for general adult medical examination without abnormal findings: Secondary | ICD-10-CM | POA: Diagnosis not present

## 2023-06-25 DIAGNOSIS — R001 Bradycardia, unspecified: Secondary | ICD-10-CM | POA: Diagnosis not present

## 2023-06-25 DIAGNOSIS — E785 Hyperlipidemia, unspecified: Secondary | ICD-10-CM | POA: Diagnosis not present

## 2023-06-25 DIAGNOSIS — R030 Elevated blood-pressure reading, without diagnosis of hypertension: Secondary | ICD-10-CM | POA: Diagnosis not present

## 2023-06-25 DIAGNOSIS — D649 Anemia, unspecified: Secondary | ICD-10-CM | POA: Diagnosis not present

## 2023-07-30 IMAGING — US US PELVIS LIMITED
1 series · 14 of 14 positions shown · non-contrast
Comparison: None.

CLINICAL DATA: Left inguinal pain

EXAM:
LIMITED ULTRASOUND OF PELVIS
TECHNIQUE: Limited transabdominal ultrasound examination of the pelvis was
performed.

[Series 1: us pelvis limited · 0.08mm/px · 14 acquisitions, 14 frames shown]
[im 1/14]
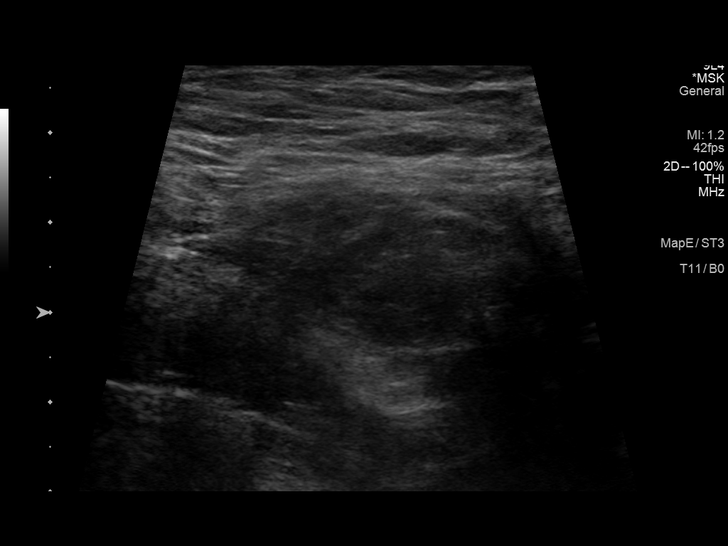
[im 2/14]
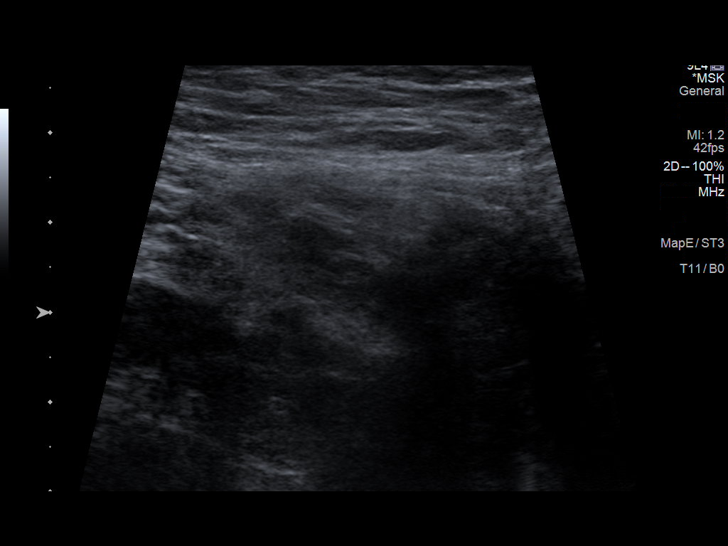
[im 3/14]
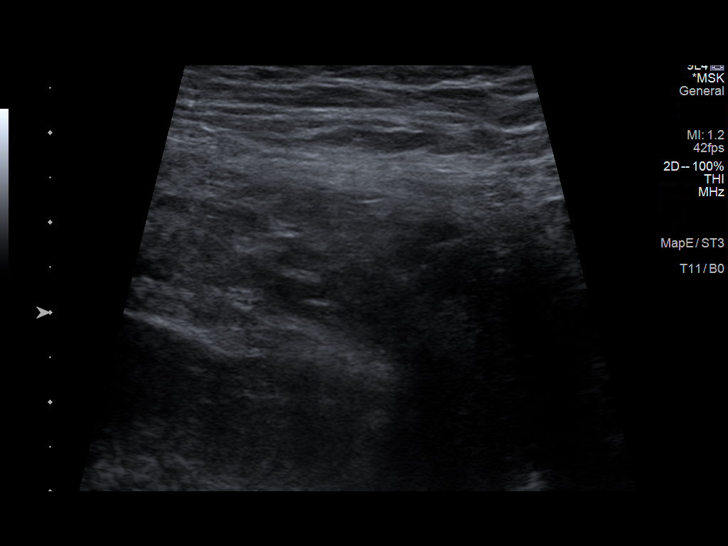
[im 4/14]
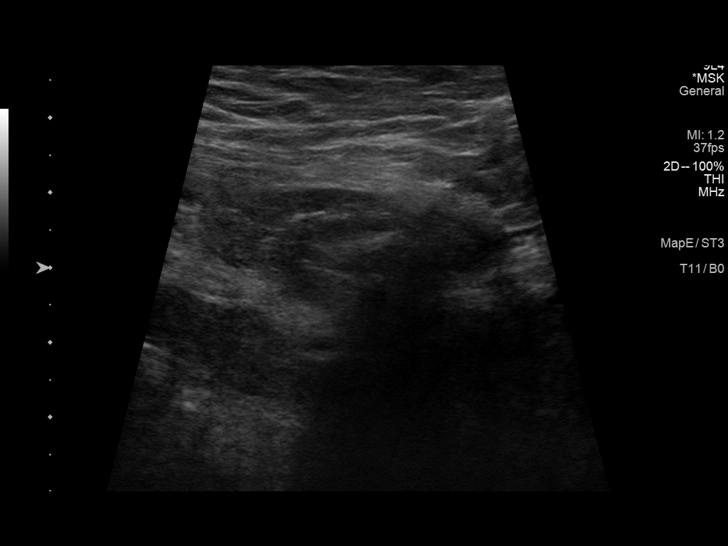
[im 5/14]
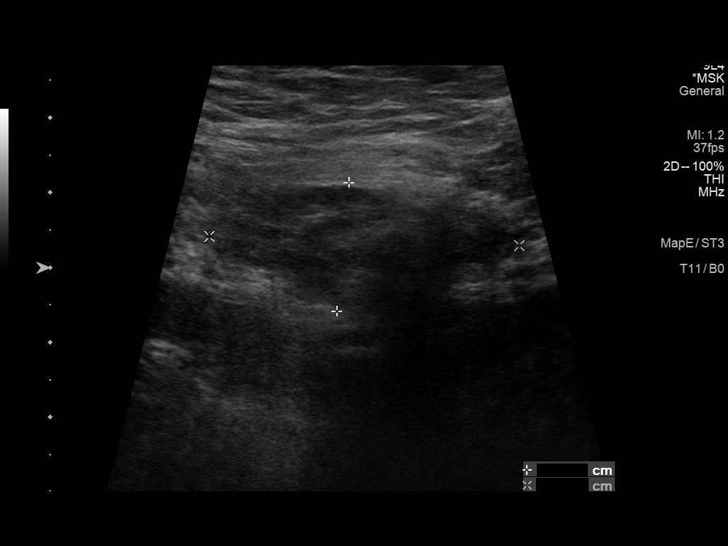
[im 6/14]
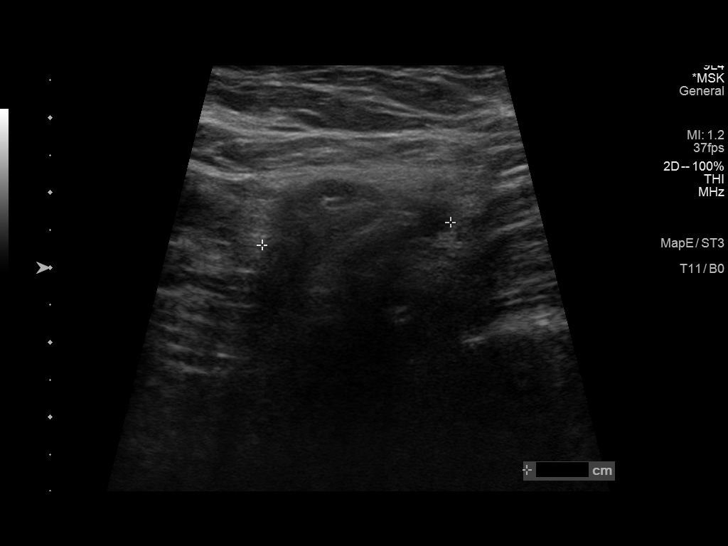
[im 7/14]
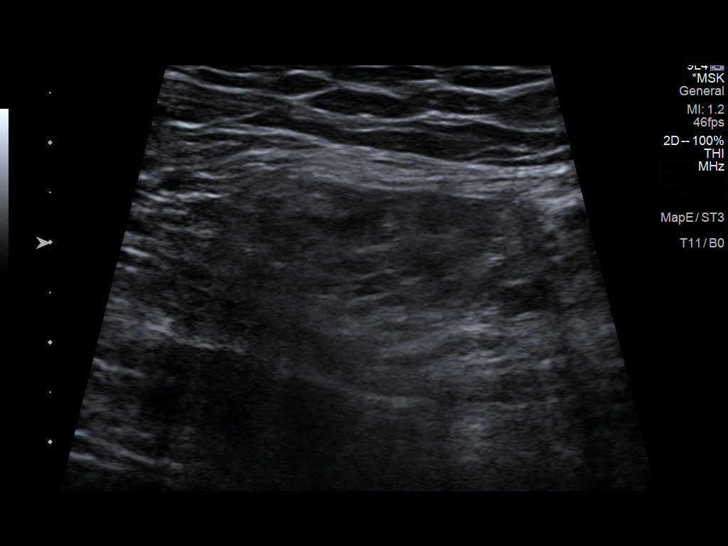
[im 8/14]
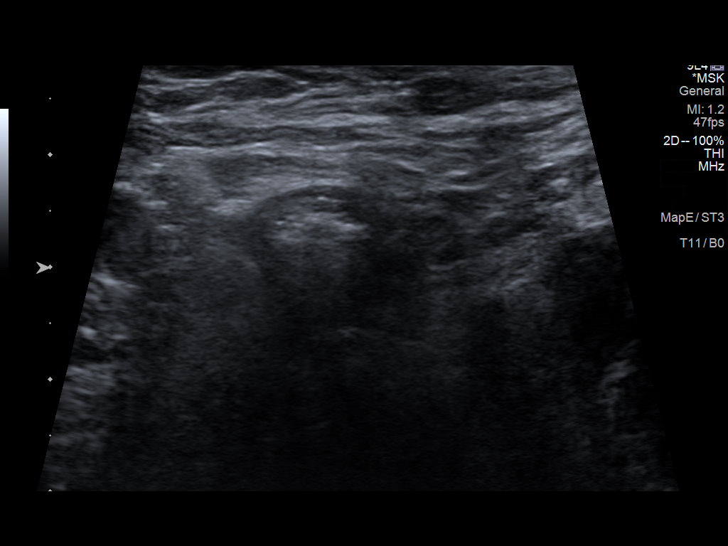
[im 9/14]
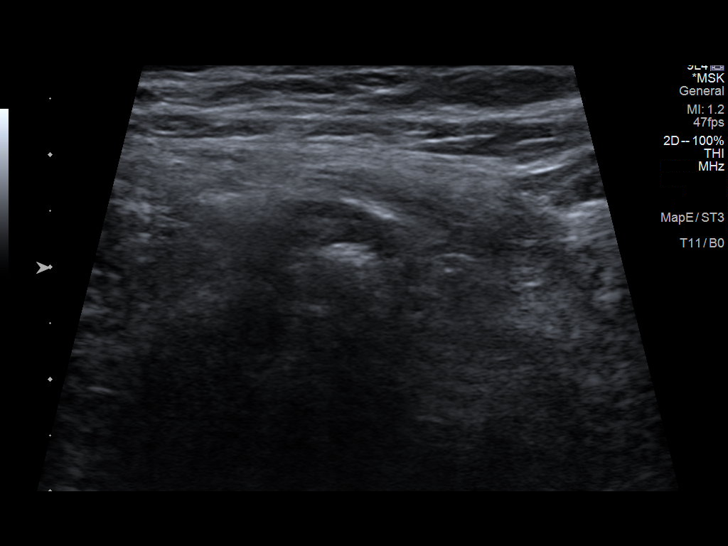
[im 10/14]
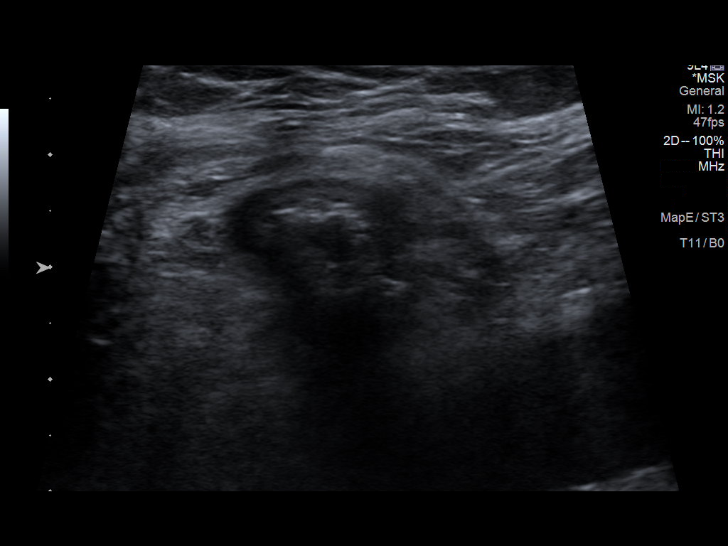
[im 11/14]
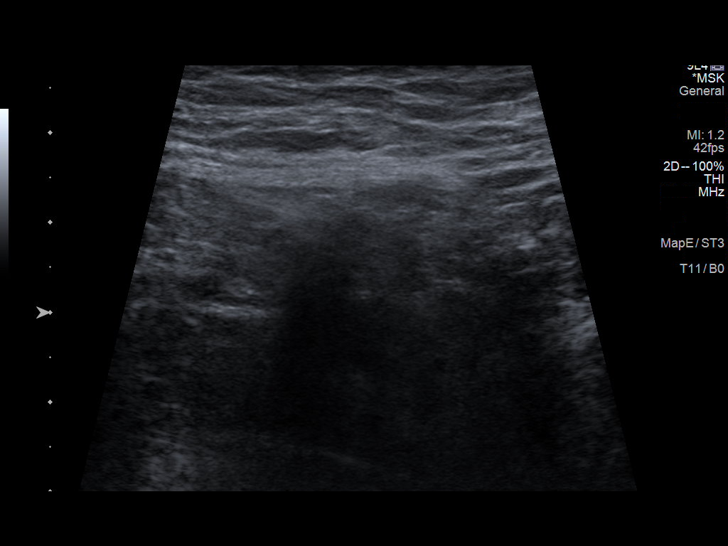
[im 12/14]
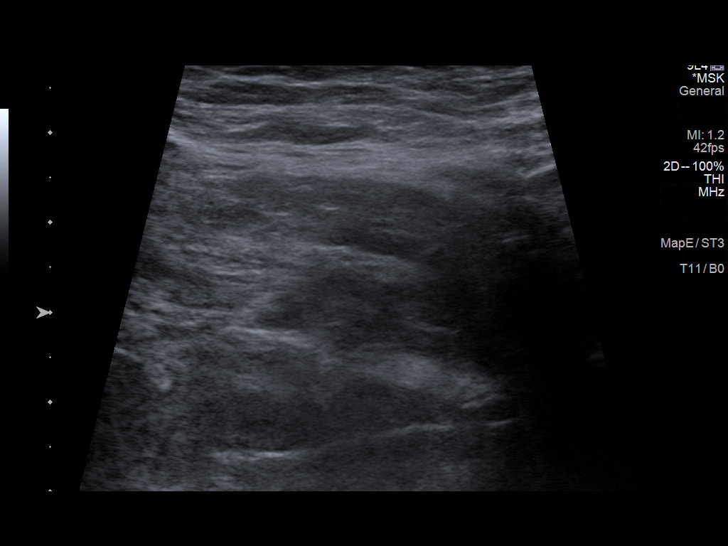
[im 13/14]
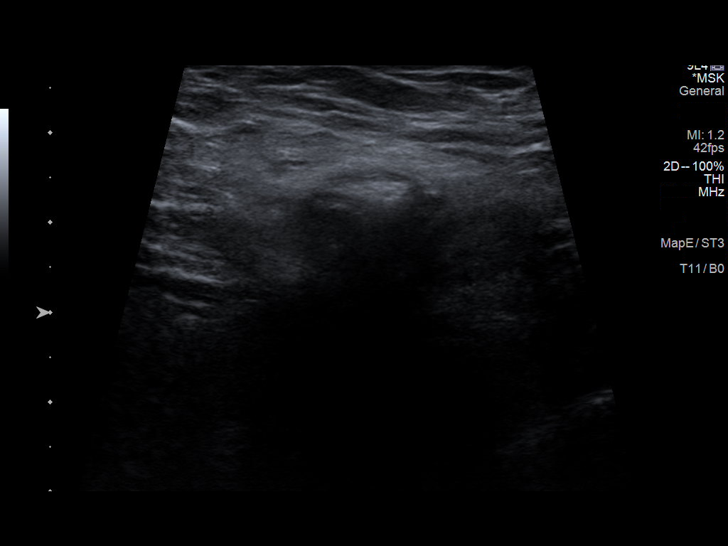
[im 14/14]
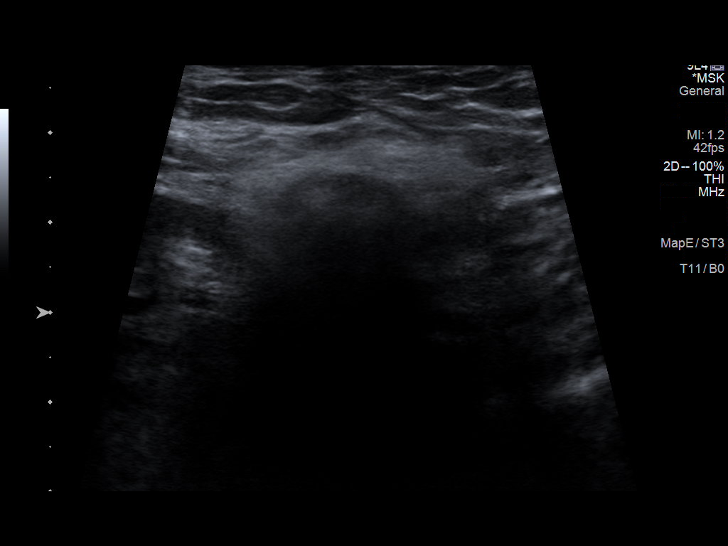

[14 of 14 positions shown; findings below may reference images not displayed]

FINDINGS: Targeted ultrasound of the left inguinal region performed at rest
and with Valsalva. There is a bowel containing left inguinal hernia.
Hernia appears reducible.
IMPRESSION: Positive for bowel containing left inguinal hernia

## 2023-08-29 ENCOUNTER — Ambulatory Visit (HOSPITAL_BASED_OUTPATIENT_CLINIC_OR_DEPARTMENT_OTHER)
Admission: RE | Admit: 2023-08-29 | Discharge: 2023-08-29 | Disposition: A | Discharge status: Home / Self Care | Source: Ambulatory Visit | Attending: Pulmonary Disease | Admitting: Pulmonary Disease

## 2023-08-29 DIAGNOSIS — J852 Abscess of lung without pneumonia: Secondary | ICD-10-CM | POA: Diagnosis not present

## 2023-08-29 DIAGNOSIS — R918 Other nonspecific abnormal finding of lung field: Secondary | ICD-10-CM | POA: Diagnosis not present

## 2023-08-29 DIAGNOSIS — J40 Bronchitis, not specified as acute or chronic: Secondary | ICD-10-CM | POA: Diagnosis not present

## 2023-09-20 ENCOUNTER — Telehealth: Payer: Self-pay | Admitting: Pulmonary Disease

## 2023-09-20 NOTE — Telephone Encounter (Signed)
 [phone] & [phone]  Please call PT w/CT results. Thanks.

## 2023-09-21 NOTE — Telephone Encounter (Signed)
 Patient would like CT result's   Dr Diania Fortes can you please advise   Thank  you

## 2023-09-28 NOTE — Telephone Encounter (Signed)
ATC x1 LVM for patient to call our office back. 

## 2023-09-28 NOTE — Telephone Encounter (Signed)
 I  have researched patients chart. A recall was sent to pt on 08/13/2023 to schedule OV.  Will reschedule pt when he calls back.

## 2023-09-28 NOTE — Telephone Encounter (Signed)
 Please let patient know that his lung abscess has cleared up nicely.   Margie/Baily - patient should of had a follow up visit to review this CT scan and I do not seem him scheduled for a f/u visit.  JD

## 2023-10-01 ENCOUNTER — Ambulatory Visit: Admitting: Adult Health

## 2023-10-01 ENCOUNTER — Encounter: Payer: Self-pay | Admitting: Adult Health

## 2023-10-01 VITALS — BP 140/80 | HR 67 | Temp 98.2°F | Ht 73.0 in | Wt 187.6 lb

## 2023-10-01 DIAGNOSIS — J852 Abscess of lung without pneumonia: Secondary | ICD-10-CM | POA: Diagnosis not present

## 2023-10-01 DIAGNOSIS — R911 Solitary pulmonary nodule: Secondary | ICD-10-CM | POA: Diagnosis not present

## 2023-10-01 DIAGNOSIS — Z87891 Personal history of nicotine dependence: Secondary | ICD-10-CM | POA: Diagnosis not present

## 2023-10-01 NOTE — Telephone Encounter (Signed)
 Pt is scheduled to see Tammy Parrett, Np today. NFN

## 2023-10-01 NOTE — Patient Instructions (Addendum)
 CT chest in March 2026  Discuss with Primary MD regarding Atherosclerosis noted on CT scan  Follow up with Dr. Kara in March 2026 and As needed

## 2023-10-01 NOTE — Telephone Encounter (Signed)
 Copied from CRM [id number]. Topic: Clinical - Lab/Test Results >> Oct 01, 2023  9:17 AM Russell PARAS wrote: Reason for CRM:   Pt returning call from clinic regarding results. Reviewed results with pt, no questions at this time.   Did schedule pt for FU w/Parrett at 1:30 PM today(06/23), as mentioned in note from provider  Addressed.NFn

## 2023-10-01 NOTE — Progress Notes (Signed)
 @Patient  ID: Matthew Singleton, male    DOB: 07/16/1940, 83 y.o.   MRN: [id number]  Chief Complaint  Patient presents with   Follow-up    Referring provider: Chrystal Lamarr RAMAN, *  HPI: 83 year old male followed for mild emphysema and history of  right lower lobe lung abscess  TEST/EVENTS :  CT Chest 08/29/22 with large hiatal hernia, 11cm bullous lesion, fluid filled with some thickening of wall, paraseptal emphysema, LUL 1.1cm pleural nodule  CT chest Aug 29, 2018 following she had previous right upper lobe lesion no longer present with only mild scarring in this location.  Stable emphysema.  Stable nodular consolidation measuring 11 x 7 mm in the right middle lobe.,  Atherosclerosis, hiatal hernia  10/01/2023 Follow up: Hx of Lung abscess  Discussed the use of AI scribe software for clinical note transcription with the patient, who gave verbal consent to proceed.  History of Present Illness   Matthew Singleton is an 83 year old male with mild emphysema who presents for a six-month follow-up.  He had a COVID-19 infection in February 2024, followed by pneumonia in May 2024, which was treated with antibiotics, CT scans showed a cavitary lesion in the right upper lobe. Serial CT chest has shown decrease in cavitary lesion. Most recent Ct chest scan in May 2025 indicating resolution of the abscess with residual scarring. He has a stable right middle lobe nodule measuring 11 by 7 millimeters.  He has a history of smoking, having quit in 1974 at the age of 49 after approximately 10+years of heavy smoking. He reports no current smoking and maintains an active lifestyle, walking daily.  Atherosclerosis has been noted on previous CT scans. No symptoms such as chest pain, palpitations, or shortness of breath. His cholesterol is well-controlled. Recommended he follow up with Primary care to discuss these finding.   Patient says he is very active.  Walks on a regular basis.           Allergies   Allergen Reactions   Penicillins Rash     There is no immunization history on file for this patient.  No past medical history on file.  Tobacco History: Social History   Tobacco Use  Smoking Status Former   Current packs/day: 0.00   Average packs/day: 2.0 packs/day for 14.0 years (28.0 ttl pk-yrs)   Types: Cigarettes   Start date: 04/10/1958   Quit date: 04/10/1972   Years since quitting: 51.5  Smokeless Tobacco Not on file   Counseling given: Not Answered   Outpatient Medications Prior to Visit  Medication Sig Dispense Refill   loratadine (CLARITIN) 10 MG tablet Take 10 mg by mouth daily as needed for allergies.     No facility-administered medications prior to visit.     Review of Systems:   Constitutional:   No  weight loss, night sweats,  Fevers, chills, fatigue, or  lassitude.  HEENT:   No headaches,  Difficulty swallowing,  Tooth/dental problems, or  Sore throat,                No sneezing, itching, ear ache, nasal congestion, post nasal drip,   CV:  No chest pain,  Orthopnea, PND, swelling in lower extremities, anasarca, dizziness, palpitations, syncope.   GI  No heartburn, indigestion, abdominal pain, nausea, vomiting, diarrhea, change in bowel habits, loss of appetite, bloody stools.   Resp: No shortness of breath with exertion or at rest.  No excess mucus, no productive cough,  No non-productive  cough,  No coughing up of blood.  No change in color of mucus.  No wheezing.  No chest wall deformity  Skin: no rash or lesions.  GU: no dysuria, change in color of urine, no urgency or frequency.  No flank pain, no hematuria   MS:  No joint pain or swelling.  No decreased range of motion.  No back pain.    Physical Exam  BP (!) 140/80 (BP Location: Left Arm, Patient Position: Sitting, Cuff Size: Normal)   Pulse 67   Temp 98.2 F (36.8 C) (Oral)   Ht 6' 1 (1.854 m)   Wt 187 lb 9.6 oz (85.1 kg)   SpO2 98%   BMI 24.75 kg/m   GEN: A/Ox3; pleasant , NAD,  well nourished    HEENT:  [place]/AT,  NOSE-clear, THROAT-clear, no lesions, no postnasal drip or exudate noted.   NECK:  Supple w/ fair ROM; no JVD; normal carotid impulses w/o bruits; no thyromegaly or nodules palpated; no lymphadenopathy.    RESP  Clear  P & A; w/o, wheezes/ rales/ or rhonchi. no accessory muscle use, no dullness to percussion  CARD:  RRR, no m/r/g, no peripheral edema, pulses intact, no cyanosis or clubbing.  GI:   Soft & nt; nml bowel sounds; no organomegaly or masses detected.   Musco: Warm bil, no deformities or joint swelling noted.   Neuro: alert, no focal deficits noted.    Skin: Warm, no lesions or rashes    Lab Results:  CBC No results found for: WBC, RBC, HGB, HCT, PLT, MCV, MCH, MCHC, RDW, LYMPHSABS, MONOABS, EOSABS, BASOSABS  BMET No results found for: NA, K, CL, CO2, GLUCOSE, BUN, CREATININE, CALCIUM, GFRNONAA, GFRAA  BNP No results found for: BNP  ProBNP No results found for: PROBNP  Imaging: No results found.  Administration History     None           No data to display          No results found for: NITRICOXIDE      Assessment & Plan:  Assessment and Plan    Emphysema   Emphysema is present with lung elasticity loss and stiffness, likely due to past smoking.. Smoking cessation at age 54 likely contributed to condition stability. Encourage a healthy lifestyle.  Patient has no significant symptom burden.  Activity as tolerated.  Right middle lobe lung nodule   A right middle lobe lung nodule measuring 11x7 mm has remained stable over six months.  Order a follow-up CT chest in 9 months to monitor nodule stability.  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 The initial infection followed by pneumonia was treated with antibiotics. CT scans confirm abscess resolution with remaining  scarring, which is common post-infection and requires no further treatment.  Atherosclerosis   Atherosclerosis is indicated by arterial plaque buildup on CT scans, though there are no cardiovascular symptoms and cholesterol levels reported as  good. Discuss findings with the primary care provider for further management. Ensure blood pressure and cholesterol are well-managed. Consider a cardiology referral if symptoms develop.     Plan  Patient Instructions  CT chest in March 2026  Discuss with Primary MD regarding Atherosclerosis noted on CT scan  Follow up with Dr. Kara in March 2026 and As needed         Madelin Stank, NP 10/01/2023

## 2023-11-02 DIAGNOSIS — I2510 Atherosclerotic heart disease of native coronary artery without angina pectoris: Secondary | ICD-10-CM | POA: Diagnosis not present

## 2023-11-02 DIAGNOSIS — R030 Elevated blood-pressure reading, without diagnosis of hypertension: Secondary | ICD-10-CM | POA: Diagnosis not present

## 2024-01-20 NOTE — Progress Notes (Unsigned)
 Cardiology Office Note:  .   Date:  01/21/2024  ID:  Matthew Singleton, DOB October 09, 1940, MRN [id number] PCP: Chrystal Lamarr RAMAN, MD  [facility] Providers Cardiologist:  None { History of Present Illness: .    Chief Complaint  Patient presents with   Coronary Artery Disease         Matthew Singleton is a 83 y.o. male with history of coronary calcifications  who presents for the evaluation of CAD at the request of Chrystal Lamarr RAMAN, MD.   History of Present Illness   Matthew Singleton is an 83 year old male who presents for evaluation of coronary artery disease. He was referred by Dr. Chrystal for evaluation of coronary artery disease.  He underwent a recent CT scan of the chest, which revealed coronary calcifications. No chest pain or trouble breathing. He is able to walk up to 1.8 miles per day without limitations. No history of heart attacks or strokes. He is currently walking 1.8 miles daily without CP or SOB.   He has hyperlipidemia and was started on rosuvastatin 10 mg daily in March. His most recent lipid profile showed an LDL of 166 mg/dL and total cholesterol of 242 mg/dL. He is also taking a baby aspirin daily.  He has a history of emphysema and had COVID-19 in February 2024, followed by double pneumonia and a lung abscess. He is a former smoker, having quit 50 years ago.  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 He has not experienced any symptoms related to this.  Socially, he is retired, married, and has one child and two grandchildren. He drinks alcohol occasionally, with increased consumption during summer activities.          Problem List Coronary calcification on chest CT Emphysema  HLD -T chol 242, HDL 55, LDL 166, TG 116    ROS: All other ROS reviewed and negative. Pertinent positives noted in the HPI.     Studies Reviewed: SABRA   EKG Interpretation Date/Time:  Monday  January 21 2024 14:18:37 EDT Ventricular Rate:  67 PR Interval:  148 QRS Duration:  102 QT Interval:  392 QTC Calculation: 414 R Axis:   0  Text Interpretation: Normal sinus rhythm Normal ECG Confirmed by Barbaraann Kotyk [phone]) on 01/21/2024 2:23:30 PM   Physical Exam:   VS:  BP 129/71 (BP Location: Left Arm, Patient Position: Sitting, Cuff Size: Normal)   Pulse 78   Ht 6' 1 (1.854 m)   Wt 187 lb (84.8 kg)   SpO2 97%   BMI 24.67 kg/m    Wt Readings from Last 3 Encounters:  01/21/24 187 lb (84.8 kg)  10/01/23 187 lb 9.6 oz (85.1 kg)  02/14/23 190 lb 3.2 oz (86.3 kg)    GEN: Well nourished, well developed in no acute distress NECK: No JVD; No carotid bruits CARDIAC: RRR, no murmurs, rubs, gallops RESPIRATORY:  Clear to auscultation without rales, wheezing or rhonchi  ABDOMEN: Soft, non-tender, non-distended EXTREMITIES:  No edema; No deformity  ASSESSMENT AND PLAN: .   Assessment and Plan    Coronary artery disease with coronary calcification Coronary calcification noted on chest CT. No angina. EKG normal sinus rhythm, no acute ischemia. - Order coronary calcium score. He has no symptoms of angina. A formal calcium score will guide treatment including ASA need and lipid goal. If score is not accelerated would continue with medical management. May  need stress or CCTA pending formal score.  - Continue rosuvastatin and aspirin. - Evaluate calcium score for further testing or treatment adjustments.  Hyperlipidemia Managed with rosuvastatin 10 mg daily. LDL 166 mg/dL, total cholesterol 757 mg/dL. Statin dose at a quarter of maximum. - Continue rosuvastatin 10 mg daily. - Reassess lipid levels and treatment plan based on coronary calcium score results.              Follow-up: Return if symptoms worsen or fail to improve.   Signed, Darryle DASEN. Barbaraann, MD, [facility]  [facility], [place]  [address] [place], [place] [address] [phone]  2:39 PM

## 2024-01-21 ENCOUNTER — Encounter: Payer: Self-pay | Admitting: Cardiovascular Disease

## 2024-01-21 ENCOUNTER — Ambulatory Visit: Attending: Cardiovascular Disease | Admitting: Cardiovascular Disease

## 2024-01-21 ENCOUNTER — Ambulatory Visit (HOSPITAL_COMMUNITY)
Admission: RE | Admit: 2024-01-21 | Discharge: 2024-01-21 | Disposition: A | Discharge status: Home / Self Care | Payer: Self-pay | Source: Ambulatory Visit | Attending: Cardiovascular Disease | Admitting: Cardiovascular Disease

## 2024-01-21 VITALS — BP 129/71 | HR 78 | Ht 73.0 in | Wt 187.0 lb

## 2024-01-21 DIAGNOSIS — I150 Renovascular hypertension: Secondary | ICD-10-CM

## 2024-01-21 DIAGNOSIS — I700 Atherosclerosis of aorta: Secondary | ICD-10-CM | POA: Diagnosis not present

## 2024-01-21 DIAGNOSIS — E782 Mixed hyperlipidemia: Secondary | ICD-10-CM | POA: Diagnosis not present

## 2024-01-21 DIAGNOSIS — I2510 Atherosclerotic heart disease of native coronary artery without angina pectoris: Secondary | ICD-10-CM | POA: Diagnosis not present

## 2024-01-21 DIAGNOSIS — I77810 Thoracic aortic ectasia: Secondary | ICD-10-CM | POA: Diagnosis not present

## 2024-01-21 DIAGNOSIS — R079 Chest pain, unspecified: Secondary | ICD-10-CM | POA: Insufficient documentation

## 2024-01-21 NOTE — Patient Instructions (Signed)
 Medication Instructions:  No medication changes were made at this visit. Continue current regimen.   *If you need a refill on your cardiac medications before your next appointment, please call your pharmacy*  Lab Work: None ordered today. If you have labs (blood work) drawn today and your tests are completely normal, you will receive your results only by: MyChart Message (if you have MyChart) OR A paper copy in the mail If you have any lab test that is abnormal or we need to change your treatment, we will call you to review the results.  Testing/Procedures: Your physician has requested that you have a coronary calcium score performed. This is not covered by insurance and will be an out-of-pocket cost of approximately $99.   Follow-Up: At [facility], you and your health needs are our priority.  As part of our continuing mission to provide you with exceptional heart care, our providers are all part of one team.  This team includes your primary Cardiologist (physician) and Advanced Practice Providers or APPs (Physician Assistants and Nurse Practitioners) who all work together to provide you with the care you need, when you need it.  Your next appointment:   As needed  Provider:   Dr. Barbaraann

## 2024-01-22 ENCOUNTER — Ambulatory Visit: Payer: Self-pay | Admitting: Cardiovascular Disease

## 2024-01-22 DIAGNOSIS — E782 Mixed hyperlipidemia: Secondary | ICD-10-CM

## 2024-01-24 MED ORDER — ROSUVASTATIN CALCIUM 20 MG PO TABS
20.0000 mg | ORAL_TABLET | Freq: Every day | ORAL | 3 refills | Status: AC
Start: 2024-01-24 — End: ?

## 2024-02-13 DIAGNOSIS — E785 Hyperlipidemia, unspecified: Secondary | ICD-10-CM | POA: Diagnosis not present

## 2024-06-19 ENCOUNTER — Ambulatory Visit (HOSPITAL_BASED_OUTPATIENT_CLINIC_OR_DEPARTMENT_OTHER)

## 2024-07-07 ENCOUNTER — Ambulatory Visit: Admitting: Pulmonary Disease
